# Patient Record
Sex: Female | Born: 1990 | Race: Black or African American | Hispanic: No | Marital: Married | Smoking: Never smoker
Health system: Southern US, Community
[De-identification: ages and names within clinical notes are randomized; demographics above are authoritative.]

---
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
## 2009-10-14 ENCOUNTER — Encounter: Admission: RE | Admit: 2009-10-14 | Discharge: 2009-10-14 | Payer: Self-pay | Admitting: Family Medicine

## 2014-08-29 ENCOUNTER — Emergency Department (HOSPITAL_COMMUNITY)
Admission: EM | Admit: 2014-08-29 | Discharge: 2014-08-30 | Disposition: A | Discharge status: Home / Self Care | Payer: 59 | Attending: Emergency Medicine | Admitting: Emergency Medicine

## 2014-08-29 ENCOUNTER — Other Ambulatory Visit: Payer: Self-pay

## 2014-08-29 ENCOUNTER — Encounter (HOSPITAL_COMMUNITY): Payer: Self-pay | Admitting: *Deleted

## 2014-08-29 DIAGNOSIS — R067 Sneezing: Secondary | ICD-10-CM | POA: Insufficient documentation

## 2014-08-29 DIAGNOSIS — R51 Headache: Secondary | ICD-10-CM | POA: Insufficient documentation

## 2014-08-29 DIAGNOSIS — R0981 Nasal congestion: Secondary | ICD-10-CM | POA: Insufficient documentation

## 2014-08-29 DIAGNOSIS — R0789 Other chest pain: Secondary | ICD-10-CM | POA: Diagnosis not present

## 2014-08-29 DIAGNOSIS — R002 Palpitations: Secondary | ICD-10-CM | POA: Diagnosis not present

## 2014-08-29 DIAGNOSIS — R079 Chest pain, unspecified: Secondary | ICD-10-CM | POA: Diagnosis present

## 2014-08-29 MED ORDER — GI COCKTAIL ~~LOC~~
30.0000 mL | Freq: Once | ORAL | Status: AC
  Administered 2014-08-29: 30 mL via ORAL
  Filled 2014-08-29: qty 30

## 2014-08-29 NOTE — ED Notes (Signed)
Pt placed in a gown with 5 lead, BP cuff and pulse ox on

## 2014-08-29 NOTE — ED Notes (Signed)
Pt states she has irregular periods, had 4 pregnancy tests with PCP this week which were negative

## 2014-08-29 NOTE — ED Notes (Signed)
Pt reports a "pulling" feeling on her heart about two hours ago. Pt denies any associated symptoms with the pain. Pt states pain at this time is very minimal.

## 2014-08-29 NOTE — ED Provider Notes (Signed)
CSN: [id number]     Arrival date & time 08/29/14  2225 History  This chart was scribed for Olivia Mackie, MD by Abel Presto, ED Scribe. This patient was seen in room A02C/A02C and the patient's care was started at 11:29 PM.     Chief Complaint  Patient presents with  . Chest Pain     Patient is a 24 y.o. female presenting with chest pain. The history is provided by the patient. No language interpreter was used.  Chest Pain Associated symptoms: headache (mild) and palpitations   Associated symptoms: no abdominal pain, no back pain, no cough, no diaphoresis, no nausea and no shortness of breath   HPI Comments: Erica Acosta is a 24 y.o. female who presents to the Emergency Department complaining of waxing and waning midline chest pain with onset around 8 PM. Pt describes the pain as a "pulling" feeling but denies a burning sensation. She notes initial pain lasted 2 hours.  She is currently experiencing pain but notes it is mild. Pt notes associated mild headache and brief palpitations. Pt notes congestion and sneezing last week, but denies cough.  Pt denies pain radiation. Pt states she ate quesadillas from [employer] around 4 PM and denies history of GERD. Pt denies significant PMHx, smoking, PSH, bcp use, and recent prolonged travel.  Pt has FHx of DM but denies history of blood clots. Pt denies SOB, pain with deep inspiration, diaphoresis, back pain, abdominal pain, leg swelling or tenderness, and nausea. Pt sees a doctor at [facility].  History reviewed. No pertinent past medical history. History reviewed. No pertinent past surgical history. History reviewed. No pertinent family history. History  Substance Use Topics  . Smoking status: Never Smoker   . Smokeless tobacco: Never Used  . Alcohol Use: No   OB History    No data available     Review of Systems  Constitutional: Negative for diaphoresis.  HENT: Positive for congestion (resolved) and sneezing (resolved).    Respiratory: Negative for cough and shortness of breath.   Cardiovascular: Positive for chest pain and palpitations. Negative for leg swelling.  Gastrointestinal: Negative for nausea and abdominal pain.  Musculoskeletal: Negative for back pain.  Neurological: Positive for headaches (mild).      Allergies  Sulfa antibiotics  Home Medications   Prior to Admission medications   Not on File   BP 139/79 mmHg  Pulse 105  Temp(Src) 98.8 F (37.1 C) (Oral)  Resp 18  Ht  (1.626 m)  Wt 163 lb (73.936 kg)  BMI 27.97 kg/m2  SpO2 98%  LMP 03/04/2014 Physical Exam  Constitutional: She is oriented to person, place, and time. She appears well-developed and well-nourished. No distress.  HENT:  Head: Normocephalic and atraumatic.  Right Ear: External ear normal.  Left Ear: External ear normal.  Nose: Nose normal.  Mouth/Throat: Oropharynx is clear and moist.  Eyes: Conjunctivae and EOM are normal. Pupils are equal, round, and reactive to light.  Neck: Normal range of motion. Neck supple. No JVD present. No tracheal deviation present. No thyromegaly present.  Cardiovascular: Normal rate, regular rhythm, normal heart sounds and intact distal pulses.  Exam reveals no gallop and no friction rub.   No murmur heard. Pulmonary/Chest: Effort normal and breath sounds normal. No stridor. No respiratory distress. She has no wheezes. She has no rales. She exhibits no tenderness.  Abdominal: Soft. Bowel sounds are normal. She exhibits no distension and no mass. There is no tenderness. There  is no rebound and no guarding.  Musculoskeletal: Normal range of motion. She exhibits no edema or tenderness.  Lymphadenopathy:    She has no cervical adenopathy.  Neurological: She is alert and oriented to person, place, and time. She displays normal reflexes. No cranial nerve deficit. She exhibits normal muscle tone. Coordination normal.  Skin: Skin is warm and dry. No rash noted. No erythema. No pallor.   Psychiatric: She has a normal mood and affect. Her behavior is normal. Judgment and thought content normal.  Nursing note and vitals reviewed.   ED Course  Procedures (including critical care time) DIAGNOSTIC STUDIES: Oxygen Saturation is 98% on room air, normal by my interpretation.    COORDINATION OF CARE: 11:38 PM Discussed treatment plan with patient at beside, the patient agrees with the plan and has no further questions at this time.   Labs Review Labs Reviewed  COMPREHENSIVE METABOLIC PANEL - Abnormal; Notable for the following:    Potassium 3.3 (*)    Glucose, Bld 125 (*)    GFR calc non Af Amer 90 (*)    All other components within normal limits  CBC WITH DIFFERENTIAL/PLATELET - Abnormal; Notable for the following:    WBC 11.4 (*)    Hemoglobin 10.7 (*)    HCT 33.2 (*)    MCV 69.6 (*)    MCH 22.4 (*)    RDW 15.8 (*)    Platelets 419 (*)    All other components within normal limits  TROPONIN I  D-DIMER, QUANTITATIVE    Imaging Review Dg Chest 2 View  08/30/2014   CLINICAL DATA:  Acute onset of generalized chest pain and tachycardia. Shortness of breath. Initial encounter.  EXAM: CHEST  2 VIEW  COMPARISON:  None.  FINDINGS: The lungs are well-aerated and clear. There is no evidence of focal opacification, pleural effusion or pneumothorax.  The heart is normal in size; the mediastinal contour is within normal limits. No acute osseous abnormalities are seen.  IMPRESSION: No acute cardiopulmonary process seen.   Electronically Signed   By: Roanna RaiderJeffery  Chang M.D.   On: 08/30/2014 00:22     EKG Interpretation   Date/Time:  Sunday August 29 2014 22:34:34 EST Ventricular Rate:  105 PR Interval:  122 QRS Duration: 88 QT Interval:  348 QTC Calculation: 459 R Axis:   72 Text Interpretation:  Sinus tachycardia Right atrial enlargement  Borderline ECG No old tracing to compare Confirmed by Lenoria Narine  MD, Ameir Faria  ([id number]) on 08/30/2014 12:10:57 AM       MDM   Final  diagnoses:  Chest pain  24 yo female with chest pain this evening.  No risk factors for ACS.  Pt had brief period of nonsustained sinus tachycardia upon arrival, none now.  No risk factors for DVT or PE.  Will check cxr, labs.  I personally performed the services described in this documentation, which was scribed in my presence. The recorded information has been reviewed and is accurate.   Per nursing staff, patient has had periods lasting less than 10 seconds of sinus tachycardia.  Unable to capture on monitor.  Will have patient follow-up with primary care doctor and/or cardiology for Holter monitor.  The remainder of her exam has been unremarkable.  Her pain has resolved.    Olivia Mackielga M Markanthony Gedney, MD 08/30/14 [phone]

## 2014-08-30 ENCOUNTER — Emergency Department (HOSPITAL_COMMUNITY): Payer: 59

## 2014-08-30 LAB — COMPREHENSIVE METABOLIC PANEL
ALT: 14 U/L (ref 0–35)
ANION GAP: 7 (ref 5–15)
AST: 25 U/L (ref 0–37)
Albumin: 3.5 g/dL (ref 3.5–5.2)
Alkaline Phosphatase: 67 U/L (ref 39–117)
BILIRUBIN TOTAL: 0.5 mg/dL (ref 0.3–1.2)
BUN: 9 mg/dL (ref 6–23)
CALCIUM: 9.1 mg/dL (ref 8.4–10.5)
CO2: 27 mmol/L (ref 19–32)
Chloride: 103 mmol/L (ref 96–112)
Creatinine, Ser: 0.89 mg/dL (ref 0.50–1.10)
GFR calc Af Amer: >90 mL/min (ref >90)
GFR, EST NON AFRICAN AMERICAN: 90 mL/min — AB (ref >90)
Glucose, Bld: 125 mg/dL — ABNORMAL HIGH (ref 70–99)
Potassium: 3.3 mmol/L — ABNORMAL LOW (ref 3.5–5.1)
Sodium: 137 mmol/L (ref 135–145)
Total Protein: 8.1 g/dL (ref 6.0–8.3)

## 2014-08-30 LAB — CBC WITH DIFFERENTIAL/PLATELET
BASOS PCT: 0 % (ref 0–1)
Basophils Absolute: 0 10*3/uL (ref 0.0–0.1)
EOS ABS: 0.1 10*3/uL (ref 0.0–0.7)
Eosinophils Relative: 1 % (ref 0–5)
HEMATOCRIT: 33.2 % — AB (ref 36.0–46.0)
Hemoglobin: 10.7 g/dL — ABNORMAL LOW (ref 12.0–15.0)
Lymphocytes Relative: 26 % (ref 12–46)
Lymphs Abs: 3 10*3/uL (ref 0.7–4.0)
MCH: 22.4 pg — ABNORMAL LOW (ref 26.0–34.0)
MCHC: 32.2 g/dL (ref 30.0–36.0)
MCV: 69.6 fL — AB (ref 78.0–100.0)
MONO ABS: 1 10*3/uL (ref 0.1–1.0)
Monocytes Relative: 9 % (ref 3–12)
NEUTROS ABS: 7.3 10*3/uL (ref 1.7–7.7)
NEUTROS PCT: 64 % (ref 43–77)
PLATELETS: 419 10*3/uL — AB (ref 150–400)
RBC: 4.77 MIL/uL (ref 3.87–5.11)
RDW: 15.8 % — AB (ref 11.5–15.5)
WBC: 11.4 10*3/uL — ABNORMAL HIGH (ref 4.0–10.5)

## 2014-08-30 LAB — TROPONIN I

## 2014-08-30 LAB — D-DIMER, QUANTITATIVE: D-Dimer, Quant: 0.33 ug/mL-FEU (ref 0.00–0.48)

## 2014-08-30 NOTE — Discharge Instructions (Signed)
Please follow-up with your primary care doctor and/or cardiology for further workup of your palpitations.  Most likely they will recommend you wearing a Holter monitor.  Return to emergency department for worsening condition or new concerning symptoms.   Chest Pain Observation It is often hard to give a specific diagnosis for the cause of chest pain. Among other possibilities your symptoms might be caused by inadequate oxygen delivery to your heart (angina). Angina that is not treated or evaluated can lead to a heart attack (myocardial infarction) or death. Blood tests, electrocardiograms, and X-rays may have been done to help determine a possible cause of your chest pain. After evaluation and observation, your health care provider has determined that it is unlikely your pain was caused by an unstable condition that requires hospitalization. However, a full evaluation of your pain may need to be completed, with additional diagnostic testing as directed. It is very important to keep your follow-up appointments. Not keeping your follow-up appointments could result in permanent heart damage, disability, or death. If there is any problem keeping your follow-up appointments, you must call your health care provider. HOME CARE INSTRUCTIONS  Due to the slight chance that your pain could be angina, it is important to follow your health care provider's treatment plan and also maintain a healthy lifestyle:  Maintain or work toward achieving a healthy weight.  Stay physically active and exercise regularly.  Decrease your salt intake.  Eat a balanced, healthy diet. Talk to a dietitian to learn about heart-healthy foods.  Increase your fiber intake by including whole grains, vegetables, fruits, and nuts in your diet.  Avoid situations that cause stress, anger, or depression.  Take medicines as advised by your health care provider. Report any side effects to your health care provider. Do not stop medicines or  adjust the dosages on your own.  Quit smoking. Do not use nicotine patches or gum until you check with your health care provider.  Keep your blood pressure, blood sugar, and cholesterol levels within normal limits.  Limit alcohol intake to no more than 1 drink per day for women who are not pregnant and 2 drinks per day for men.  Do not abuse drugs. SEEK IMMEDIATE MEDICAL CARE IF: You have severe chest pain or pressure which may include symptoms such as:  You feel pain or pressure in your arms, neck, jaw, or back.  You have severe back or abdominal pain, feel sick to your stomach (nauseous), or throw up (vomit).  You are sweating profusely.  You are having a fast or irregular heartbeat.  You feel short of breath while at rest.  You notice increasing shortness of breath during rest, sleep, or with activity.  You have chest pain that does not get better after rest or after taking your usual medicine.  You wake from sleep with chest pain.  You are unable to sleep because you cannot breathe.  You develop a frequent cough or you are coughing up blood.  You feel dizzy, faint, or experience extreme fatigue.  You develop severe weakness, dizziness, fainting, or chills. Any of these symptoms may represent a serious problem that is an emergency. Do not wait to see if the symptoms will go away. Call your local emergency services (911 in the [place] Do not drive yourself to the hospital. MAKE SURE YOU:  Understand these instructions.  Will watch your condition.  Will get help right away if you are not doing well or get worse. Document Released: 08/25/2010 Document Revised:  07/28/2013 Document Reviewed: 01/22/2013 ExitCare Patient Information 2015 [place]. This information is not intended to replace advice given to you by your health care provider. Make sure you discuss any questions you have with your health care provider.  Palpitations A palpitation is the feeling that your  heartbeat is irregular or is faster than normal. It may feel like your heart is fluttering or skipping a beat. Palpitations are usually not a serious problem. However, in some cases, you may need further medical evaluation. CAUSES  Palpitations can be caused by:  Smoking.  Caffeine or other stimulants, such as diet pills or energy drinks.  Alcohol.  Stress and anxiety.  Strenuous physical activity.  Fatigue.  Certain medicines.  Heart disease, especially if you have a history of irregular heart rhythms (arrhythmias), such as atrial fibrillation, atrial flutter, or supraventricular tachycardia.  An improperly working pacemaker or defibrillator. DIAGNOSIS  To find the cause of your palpitations, your health care provider will take your medical history and perform a physical exam. Your health care provider may also have you take a test called an ambulatory electrocardiogram (ECG). An ECG records your heartbeat patterns over a 24-hour period. You may also have other tests, such as:  Transthoracic echocardiogram (TTE). During echocardiography, sound waves are used to evaluate how blood flows through your heart.  Transesophageal echocardiogram (TEE).  Cardiac monitoring. This allows your health care provider to monitor your heart rate and rhythm in real time.  Holter monitor. This is a portable device that records your heartbeat and can help diagnose heart arrhythmias. It allows your health care provider to track your heart activity for several days, if needed.  Stress tests by exercise or by giving medicine that makes the heart beat faster. TREATMENT  Treatment of palpitations depends on the cause of your symptoms and can vary greatly. Most cases of palpitations do not require any treatment other than time, relaxation, and monitoring your symptoms. Other causes, such as atrial fibrillation, atrial flutter, or supraventricular tachycardia, usually require further treatment. HOME CARE  INSTRUCTIONS   Avoid:  Caffeinated coffee, tea, soft drinks, diet pills, and energy drinks.  Chocolate.  Alcohol.  Stop smoking if you smoke.  Reduce your stress and anxiety. Things that can help you relax include:  A method of controlling things in your body, such as your heartbeats, with your mind (biofeedback).  Yoga.  Meditation.  Physical activity such as swimming, jogging, or walking.  Get plenty of rest and sleep. SEEK MEDICAL CARE IF:   You continue to have a fast or irregular heartbeat beyond 24 hours.  Your palpitations occur more often. SEEK IMMEDIATE MEDICAL CARE IF:  You have chest pain or shortness of breath.  You have a severe headache.  You feel dizzy or you faint. MAKE SURE YOU:  Understand these instructions.  Will watch your condition.  Will get help right away if you are not doing well or get worse. Document Released: 07/20/2000 Document Revised: 07/28/2013 Document Reviewed: 09/21/2011 [facility] Patient Information 2015 [place], [place]. This information is not intended to replace advice given to you by your health care provider. Make sure you discuss any questions you have with your health care provider.

## 2014-08-30 NOTE — ED Notes (Signed)
Dr. Otter at the bedside.  

## 2014-09-07 ENCOUNTER — Emergency Department (HOSPITAL_COMMUNITY)
Admission: EM | Admit: 2014-09-07 | Discharge: 2014-09-07 | Disposition: A | Discharge status: Home / Self Care | Payer: 59 | Attending: Emergency Medicine | Admitting: Emergency Medicine

## 2014-09-07 ENCOUNTER — Encounter (HOSPITAL_COMMUNITY): Payer: Self-pay | Admitting: Emergency Medicine

## 2014-09-07 ENCOUNTER — Emergency Department (HOSPITAL_COMMUNITY): Payer: 59

## 2014-09-07 DIAGNOSIS — Z3202 Encounter for pregnancy test, result negative: Secondary | ICD-10-CM | POA: Insufficient documentation

## 2014-09-07 DIAGNOSIS — D649 Anemia, unspecified: Secondary | ICD-10-CM | POA: Diagnosis not present

## 2014-09-07 DIAGNOSIS — R079 Chest pain, unspecified: Secondary | ICD-10-CM | POA: Diagnosis present

## 2014-09-07 DIAGNOSIS — R51 Headache: Secondary | ICD-10-CM | POA: Diagnosis not present

## 2014-09-07 DIAGNOSIS — R202 Paresthesia of skin: Secondary | ICD-10-CM | POA: Insufficient documentation

## 2014-09-07 LAB — D-DIMER, QUANTITATIVE (NOT AT ARMC)

## 2014-09-07 LAB — I-STAT TROPONIN, ED: TROPONIN I, POC: 0 ng/mL (ref 0.00–0.08)

## 2014-09-07 LAB — BASIC METABOLIC PANEL
Anion gap: 8 (ref 5–15)
BUN: 8 mg/dL (ref 6–23)
CALCIUM: 8.5 mg/dL (ref 8.4–10.5)
CHLORIDE: 105 mmol/L (ref 96–112)
CO2: 25 mmol/L (ref 19–32)
Creatinine, Ser: 0.83 mg/dL (ref 0.50–1.10)
GFR calc Af Amer: >90 mL/min (ref >90)
GFR calc non Af Amer: >90 mL/min (ref >90)
GLUCOSE: 99 mg/dL (ref 70–99)
POTASSIUM: 3.7 mmol/L (ref 3.5–5.1)
Sodium: 138 mmol/L (ref 135–145)

## 2014-09-07 LAB — CBC WITH DIFFERENTIAL/PLATELET
BASOS PCT: 0 % (ref 0–1)
Basophils Absolute: 0 10*3/uL (ref 0.0–0.1)
EOS ABS: 0.2 10*3/uL (ref 0.0–0.7)
Eosinophils Relative: 2 % (ref 0–5)
HCT: 31.8 % — ABNORMAL LOW (ref 36.0–46.0)
Hemoglobin: 10 g/dL — ABNORMAL LOW (ref 12.0–15.0)
LYMPHS ABS: 2.9 10*3/uL (ref 0.7–4.0)
Lymphocytes Relative: 32 % (ref 12–46)
MCH: 21.9 pg — ABNORMAL LOW (ref 26.0–34.0)
MCHC: 31.4 g/dL (ref 30.0–36.0)
MCV: 69.6 fL — AB (ref 78.0–100.0)
Monocytes Absolute: 0.8 10*3/uL (ref 0.1–1.0)
Monocytes Relative: 9 % (ref 3–12)
NEUTROS PCT: 57 % (ref 43–77)
Neutro Abs: 5.3 10*3/uL (ref 1.7–7.7)
Platelets: 437 10*3/uL — ABNORMAL HIGH (ref 150–400)
RBC: 4.57 MIL/uL (ref 3.87–5.11)
RDW: 15.7 % — AB (ref 11.5–15.5)
WBC: 9.2 10*3/uL (ref 4.0–10.5)

## 2014-09-07 LAB — POC URINE PREG, ED: Preg Test, Ur: NEGATIVE

## 2014-09-07 MED ORDER — OXYCODONE-ACETAMINOPHEN 5-325 MG PO TABS: ORAL_TABLET | ORAL | Status: AC

## 2014-09-07 MED ORDER — OXYCODONE-ACETAMINOPHEN 5-325 MG PO TABS
1.0000 | ORAL_TABLET | Freq: Once | ORAL | Status: AC
  Administered 2014-09-07: 1 via ORAL
  Filled 2014-09-07: qty 1

## 2014-09-07 NOTE — Discharge Instructions (Signed)
Take percocet for breakthrough pain, do not drink alcohol, drive, care for children or do other critical tasks while taking percocet.  Please follow with your primary care doctor in the next 2 days for a check-up. They must obtain records for further management.   Do not hesitate to return to the Emergency Department for any new, worsening or concerning symptoms.    Chest Pain (Nonspecific) It is often hard to give a diagnosis for the cause of chest pain. There is always a chance that your pain could be related to something serious, such as a heart attack or a blood clot in the lungs. You need to follow up with your doctor. HOME CARE  If antibiotic medicine was given, take it as directed by your doctor. Finish the medicine even if you start to feel better.  For the next few days, avoid activities that bring on chest pain. Continue physical activities as told by your doctor.  Do not use any tobacco products. This includes cigarettes, chewing tobacco, and e-cigarettes.  Avoid drinking alcohol.  Only take medicine as told by your doctor.  Follow your doctor's suggestions for more testing if your chest pain does not go away.  Keep all doctor visits you made. GET HELP IF:  Your chest pain does not go away, even after treatment.  You have a rash with blisters on your chest.  You have a fever. GET HELP RIGHT AWAY IF:   You have more pain or pain that spreads to your arm, neck, jaw, back, or belly (abdomen).  You have shortness of breath.  You cough more than usual or cough up blood.  You have very bad back or belly pain.  You feel sick to your stomach (nauseous) or throw up (vomit).  You have very bad weakness.  You pass out (faint).  You have chills. This is an emergency. Do not wait to see if the problems will go away. Call your local emergency services (911 in [place] Do not drive yourself to the hospital. MAKE SURE YOU:   Understand these instructions.  Will watch  your condition.  Will get help right away if you are not doing well or get worse. Document Released: 01/09/2008 Document Revised: 07/28/2013 Document Reviewed: 01/09/2008 [facility]Exit[facility] Patient Information 2015 [place], [place]. This information is not intended to replace advice given to you by your health care provider. Make sure you discuss any questions you have with your health care provider.

## 2014-09-07 NOTE — ED Notes (Signed)
Pt c/o CP with radiation to arms and neck with HA today; pt seen for same last week

## 2014-09-07 NOTE — ED Provider Notes (Signed)
CSN: [id number]     Arrival date & time 09/07/14  1607 History   First MD Initiated Contact with Patient 09/07/14 1700     Chief Complaint  Patient presents with  . Chest Pain     (Consider location/radiation/quality/duration/timing/severity/associated sxs/prior Treatment) HPI   Erica Acosta is a 24 y.o. female complaining of central chest pain onset this afternoon at 2 PM rated at 8 out of 10, is unable to describe quality exacerbated by palpation radiating to back and bilateral jaws and she reports a pins and needles paresthesia in the bilateral arms. He also reports a headache which is typical for her. No pain medication taken prior to arrival. Patient had similar symptoms without the radiation of pain approximately one week ago, she was seen in the ED and had blood work, cardiac markers, chest x-ray which are negative. In chart review, I see that she had a sinus tachycardia to the 170s. Patient has followed with her primary care physician [facility] urgent care, they have done an anemia panel in addition to thyroid function tests which the patient states were normal. She was to follow with cardiology but has not yet made the appointment. Patient denies exogenous estrogen, recent travel, DVT or PE, syncope, diaphoresis  History reviewed. No pertinent past medical history. History reviewed. No pertinent past surgical history. History reviewed. No pertinent family history. History  Substance Use Topics  . Smoking status: Never Smoker   . Smokeless tobacco: Never Used  . Alcohol Use: No   OB History    No data available     Review of Systems  10 systems reviewed and found to be negative, except as noted in the HPI.   Allergies  Sulfa antibiotics  Home Medications   Prior to Admission medications   Medication Sig Start Date End Date Taking? Authorizing Provider  acetaminophen (TYLENOL) 325 MG tablet Take 650 mg by mouth every 6 (six) hours as needed for mild pain.     Historical Provider, MD   BP 120/67 mmHg  Pulse 82  Temp(Src) 98.1 F (36.7 C) (Oral)  Resp 18  SpO2 98%  LMP 03/04/2014 Physical Exam  Constitutional: She is oriented to person, place, and time. She appears well-developed and well-nourished. No distress.  HENT:  Head: Normocephalic and atraumatic.  Mouth/Throat: Oropharynx is clear and moist.  Eyes: Conjunctivae and EOM are normal. Pupils are equal, round, and reactive to light.  Neck: Normal range of motion.  Cardiovascular: Normal rate, regular rhythm and intact distal pulses.   Pulmonary/Chest: Effort normal and breath sounds normal. No stridor. No respiratory distress. She has no wheezes. She has no rales. She exhibits no tenderness.  Abdominal: Soft. Bowel sounds are normal. She exhibits no distension and no mass. There is no tenderness. There is no rebound and no guarding.  Musculoskeletal: Normal range of motion. She exhibits no edema or tenderness.  No calf asymmetry, superficial collaterals, palpable cords, edema, Homans sign negative bilaterally.    Neurological: She is alert and oriented to person, place, and time.  Psychiatric: She has a normal mood and affect.  Nursing note and vitals reviewed.   ED Course  Procedures (including critical care time) Labs Review Labs Reviewed  CBC WITH DIFFERENTIAL/PLATELET - Abnormal; Notable for the following:    Hemoglobin 10.0 (*)    HCT 31.8 (*)    MCV 69.6 (*)    MCH 21.9 (*)    RDW 15.7 (*)    Platelets 437 (*)    All  other components within normal limits  BASIC METABOLIC PANEL  D-DIMER, QUANTITATIVE  I-STAT TROPOININ, ED  POC URINE PREG, ED    Imaging Review Dg Chest 2 View  09/07/2014   CLINICAL DATA:  Chest pain for 1 day, labored breathing  EXAM:  CHEST  2 VIEW  COMPARISON:  08/30/2014  FINDINGS: The heart size and mediastinal contours are within normal limits. Both lungs are clear. The visualized skeletal structures are unremarkable.  IMPRESSION: No active  cardiopulmonary disease.   Electronically Signed   By: Natasha MeadLiviu  Pop M.D.   On: 09/07/2014 16:48     EKG Interpretation None      MDM   Final diagnoses:  Chest pain, unspecified chest pain type    Filed Vitals:   09/07/14 1620 09/07/14 1810 09/07/14 1815  BP: 120/67 105/30 100/54  Pulse: 82 102 62  Temp: 98.1 F (36.7 C)    TempSrc: Oral    Resp: 18 16 17   SpO2: 98% 100% 100%    Medications  oxyCODONE-acetaminophen (PERCOCET/ROXICET) 5-325 MG per tablet 1 tablet (1 tablet Oral Given 09/07/14 1809)    [name] is a pleasant 24 y.o. female presenting with CP patient was seen for similar last week. States that this pain is slightly different because it radiates to the jaw and back. EKG with no acute abnormalities, blood work shows a mild anemia. EKG shows a right atrial enlargement which she's had in the past. Chest x-ray without abnormality and d-dimer is negative. Pain is improved in the ED given her [facility]. I've encouraged her to follow closely with him we've had an extensive discussion of return precautions.   Evaluation does not show pathology that would require ongoing emergent intervention or inpatient treatment. Pt is hemodynamically stable and mentating appropriately. Discussed findings and plan with patient/guardian, who agrees with care plan. All questions answered. Return precautions discussed and outpatient follow up given.   New Prescriptions   OXYCODONE-ACETAMINOPHEN (PERCOCET/ROXICET) 5-325 MG PER TABLET    1 to 2 tabs PO q6hrs  PRN for pain         Wynetta [place], PA-C 09/07/14 2340  Toy CookeyMegan Docherty, MD 09/08/14 [phone]

## 2014-09-30 ENCOUNTER — Encounter: Payer: Self-pay | Admitting: Cardiovascular Disease

## 2014-09-30 ENCOUNTER — Ambulatory Visit (INDEPENDENT_AMBULATORY_CARE_PROVIDER_SITE_OTHER): Payer: 59 | Admitting: Cardiovascular Disease

## 2014-09-30 VITALS — BP 110/70 | HR 91 | Ht 64.0 in | Wt 169.1 lb

## 2014-09-30 DIAGNOSIS — R079 Chest pain, unspecified: Secondary | ICD-10-CM | POA: Insufficient documentation

## 2014-09-30 DIAGNOSIS — R0789 Other chest pain: Secondary | ICD-10-CM

## 2014-09-30 NOTE — Progress Notes (Signed)
Cardiology Office Note   Date:  09/30/2014   ID:  Erica Acosta, Erica Acosta 1991-05-15, MRN [id number]  PCP:  PROVIDER NOT IN SYSTEM  Cardiologist:   Nahser, Deloris Ping, MD   Chief Complaint  Patient presents with  . Chest Pain   Problem List 1. Chest pain     History of Present Illness: Erica Acosta is a 24 y.o. female who presents for follow up of several episodes of cp. Erica Acosta is a relatively healthy young female. She's been seen twice in the emergency room for chest discomfort. Both evaluations were negative. She does not exercise regulalry. Has joined the gym and her work outs have been ok.  No CP or dyspnea  The pains were mid sternal.  Felt like a pulling sensation.  Would last 4 hours the first time.  The pain eventually resolved overnight.   Would then come and go every wee or so. She had more CP on Feb. 2.   D-dimer  was negative for PE  She eats an unrestricted diet.   Is working on that.  Needs to cut back on her sugar.    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     No past medical history on file.  No past surgical history on file.   Current Outpatient Prescriptions  Medication Sig Dispense Refill  . acetaminophen (TYLENOL) 325 MG tablet Take 650 mg by mouth every 6 (six) hours as needed for mild pain.    Marland Kitchen oxyCODONE-acetaminophen (PERCOCET/ROXICET) 5-325 MG per tablet 1 to 2 tabs PO q6hrs  PRN for pain 15 tablet 0   No current facility-administered medications for this visit.    Allergies:   Sulfa antibiotics    Social History:  The patient  reports that she has never smoked. She has never used smokeless tobacco. She reports that she does not drink alcohol or use illicit drugs.   Family History:  The patient's family history is not on file.    ROS:  Please see the history of present illness.    Review of Systems: Constitutional:  denies fever, chills, diaphoresis, appetite change and fatigue.  HEENT: denies photophobia, eye pain, redness,  hearing loss, ear pain, congestion, sore throat, rhinorrhea, sneezing, neck pain, neck stiffness and tinnitus.  Respiratory: denies SOB, DOE, cough, chest tightness, and wheezing.  Cardiovascular: admits to chest pain, denies any palpitations or  leg swelling.  Gastrointestinal: denies nausea, vomiting, abdominal pain, diarrhea, constipation, blood in stool.  Genitourinary: denies dysuria, urgency, frequency, hematuria, flank pain and difficulty urinating.  Musculoskeletal: denies  myalgias, back pain, joint swelling, arthralgias and gait problem.   Skin: denies pallor, rash and wound.  Neurological: denies dizziness, seizures, syncope, weakness, light-headedness, numbness and headaches.   Hematological: denies adenopathy, easy bruising, personal or family bleeding history.  Psychiatric/ Behavioral: denies suicidal ideation, mood changes, confusion, nervousness, sleep disturbance and agitation.       All other systems are reviewed and negative.    PHYSICAL EXAM: VS:  BP 110/70 mmHg  Pulse 91  Ht  (1.626 m)  Wt 169 lb 1.9 oz (76.712 kg)  BMI 29.01 kg/m2  SpO2 98% , BMI Body mass index is 29.01 kg/(m^2). GEN: Well nourished, well developed, in no acute distress HEENT: normal Neck: no JVD, carotid bruits, or masses Cardiac: RRR; no murmurs, rubs, or gallops,no edema , no chest wall tendernes  Respiratory:  clear to auscultation bilaterally, normal work of breathing GI: soft, nontender, nondistended, + BS MS: no  deformity or atrophy Skin: warm and dry, no rash Neuro:  Strength and sensation are intact Psych: normal   EKG:  EKG is not ordered today.    Recent Labs: 08/29/2014: ALT 14 09/07/2014: BUN 8; Creatinine 0.83; Hemoglobin 10.0*; Platelets 437*; Potassium 3.7; Sodium 138    Lipid Panel No results found for: CHOL, TRIG, HDL, CHOLHDL, VLDL, LDLCALC, LDLDIRECT    Wt Readings from Last 3 Encounters:  09/30/14 169 lb 1.9 oz (76.712 kg)  08/29/14 163 lb (73.936 kg)       Other studies Reviewed: Additional studies/ records that were reviewed today include: . Review of the above records demonstrates:    ASSESSMENT AND PLAN:  1. Noncardiac chest pain: The patient has been to the emergency room on 2 different occasions for chest pain. She's had thorough workups. There is no evidence of heart disease. She also had a negative d-dimer.  I do not think that she needs any additional workup. Her chest pains are clearly noncardiac. She has recently joined a gym and his workout several times without any episodes of chest pain or shortness of breath. She admits that she eats an unrestricted and fairly unhealthy diet. I've recommended that she work on improving her diet. I've recommended that she work out on a regular  basis. We'll see her again on an as-needed basis.   Current medicines are reviewed at length with the patient today.  The patient does not have concerns regarding medicines.  The following changes have been made:  no change   Disposition:   FU with me as needed     Signed, Nahser, Deloris PingPhilip J, MD  09/30/2014 11:44 AM    [facility] [address], [place], [place]  [address] Phone: [phone]; Fax: [phone]

## 2014-09-30 NOTE — Patient Instructions (Signed)
Your physician recommends that you continue on your current medications as directed. Please refer to the Current Medication list given to you today.  Your physician recommends that you schedule a follow-up appointment in: as needed with Dr. Nahser  

## 2015-01-05 ENCOUNTER — Encounter (HOSPITAL_COMMUNITY): Payer: Self-pay | Admitting: Emergency Medicine

## 2015-01-05 ENCOUNTER — Emergency Department (INDEPENDENT_AMBULATORY_CARE_PROVIDER_SITE_OTHER)
Admission: EM | Admit: 2015-01-05 | Discharge: 2015-01-05 | Disposition: A | Discharge status: Home / Self Care | Payer: Self-pay | Source: Home / Self Care | Attending: Emergency Medicine | Admitting: Emergency Medicine

## 2015-01-05 DIAGNOSIS — H6121 Impacted cerumen, right ear: Secondary | ICD-10-CM

## 2015-01-05 NOTE — ED Notes (Signed)
Pt states that her ear feels full for over a week

## 2015-01-05 NOTE — Discharge Instructions (Signed)
We removed earwax from your ear today. You can use a drop of olive oil in each ear to help soften wax and have come out on its own. Follow-up as needed.

## 2015-01-05 NOTE — ED Provider Notes (Signed)
CSN: [id number]     Arrival date & time 01/05/15  1834 History   First MD Initiated Contact with Patient 01/05/15 1856     Chief Complaint  Patient presents with  . Ear Fullness   (Consider location/radiation/quality/duration/timing/severity/associated sxs/prior Treatment) HPI  She is a 24 year old woman here for evaluation of right ear fullness. She states and Friday her right ear has felt stopped up. It will temporarily open up while she is in the shower. She denies any ear pain or drainage. She tried washing it out herself without improvement. No nasal congestion or rhinorrhea. No fevers.  History reviewed. No pertinent past medical history. History reviewed. No pertinent past surgical history. History reviewed. No pertinent family history. History  Substance Use Topics  . Smoking status: Never Smoker   . Smokeless tobacco: Never Used  . Alcohol Use: No   OB History    No data available     Review of Systems As in history of present illness Allergies  Sulfa antibiotics  Home Medications   Prior to Admission medications   Medication Sig Start Date End Date Taking? Authorizing Provider  acetaminophen (TYLENOL) 325 MG tablet Take 650 mg by mouth every 6 (six) hours as needed for mild pain.    Historical Provider, MD  oxyCODONE-acetaminophen (PERCOCET/ROXICET) 5-325 MG per tablet 1 to 2 tabs PO q6hrs  PRN for pain 09/07/14   Joni ReiningNicole Pisciotta, PA-C   BP 110/70 mmHg  Pulse 85  Temp(Src) 97.7 F (36.5 C) (Oral)  Resp 12  SpO2 96% Physical Exam  Constitutional: She is oriented to person, place, and time. She appears well-developed and well-nourished. No distress.  HENT:  Left TM is normal. Right ear canal is occluded with wax.  Neck: Neck supple.  Cardiovascular: Normal rate.   Pulmonary/Chest: Effort normal.  Neurological: She is alert and oriented to person, place, and time.    ED Course  Procedures (including critical care time) Labs Review Labs Reviewed - No data to  display  Imaging Review No results found.   MDM   1. Cerumen impaction, right    Ear washed out with resolution of symptoms.  Tympanic membrane is normal. Follow-up as needed.    Charm RingsErin J Khaleelah Yowell, MD 01/05/15 1950

## 2015-05-07 IMAGING — DX DG CHEST 2V
2 series · 2 of 2 positions shown · non-contrast
Comparison: 08/30/2014

CLINICAL DATA: Chest pain for 1 day, labored breathing

EXAM:
CHEST  2 VIEW

[chest pa]
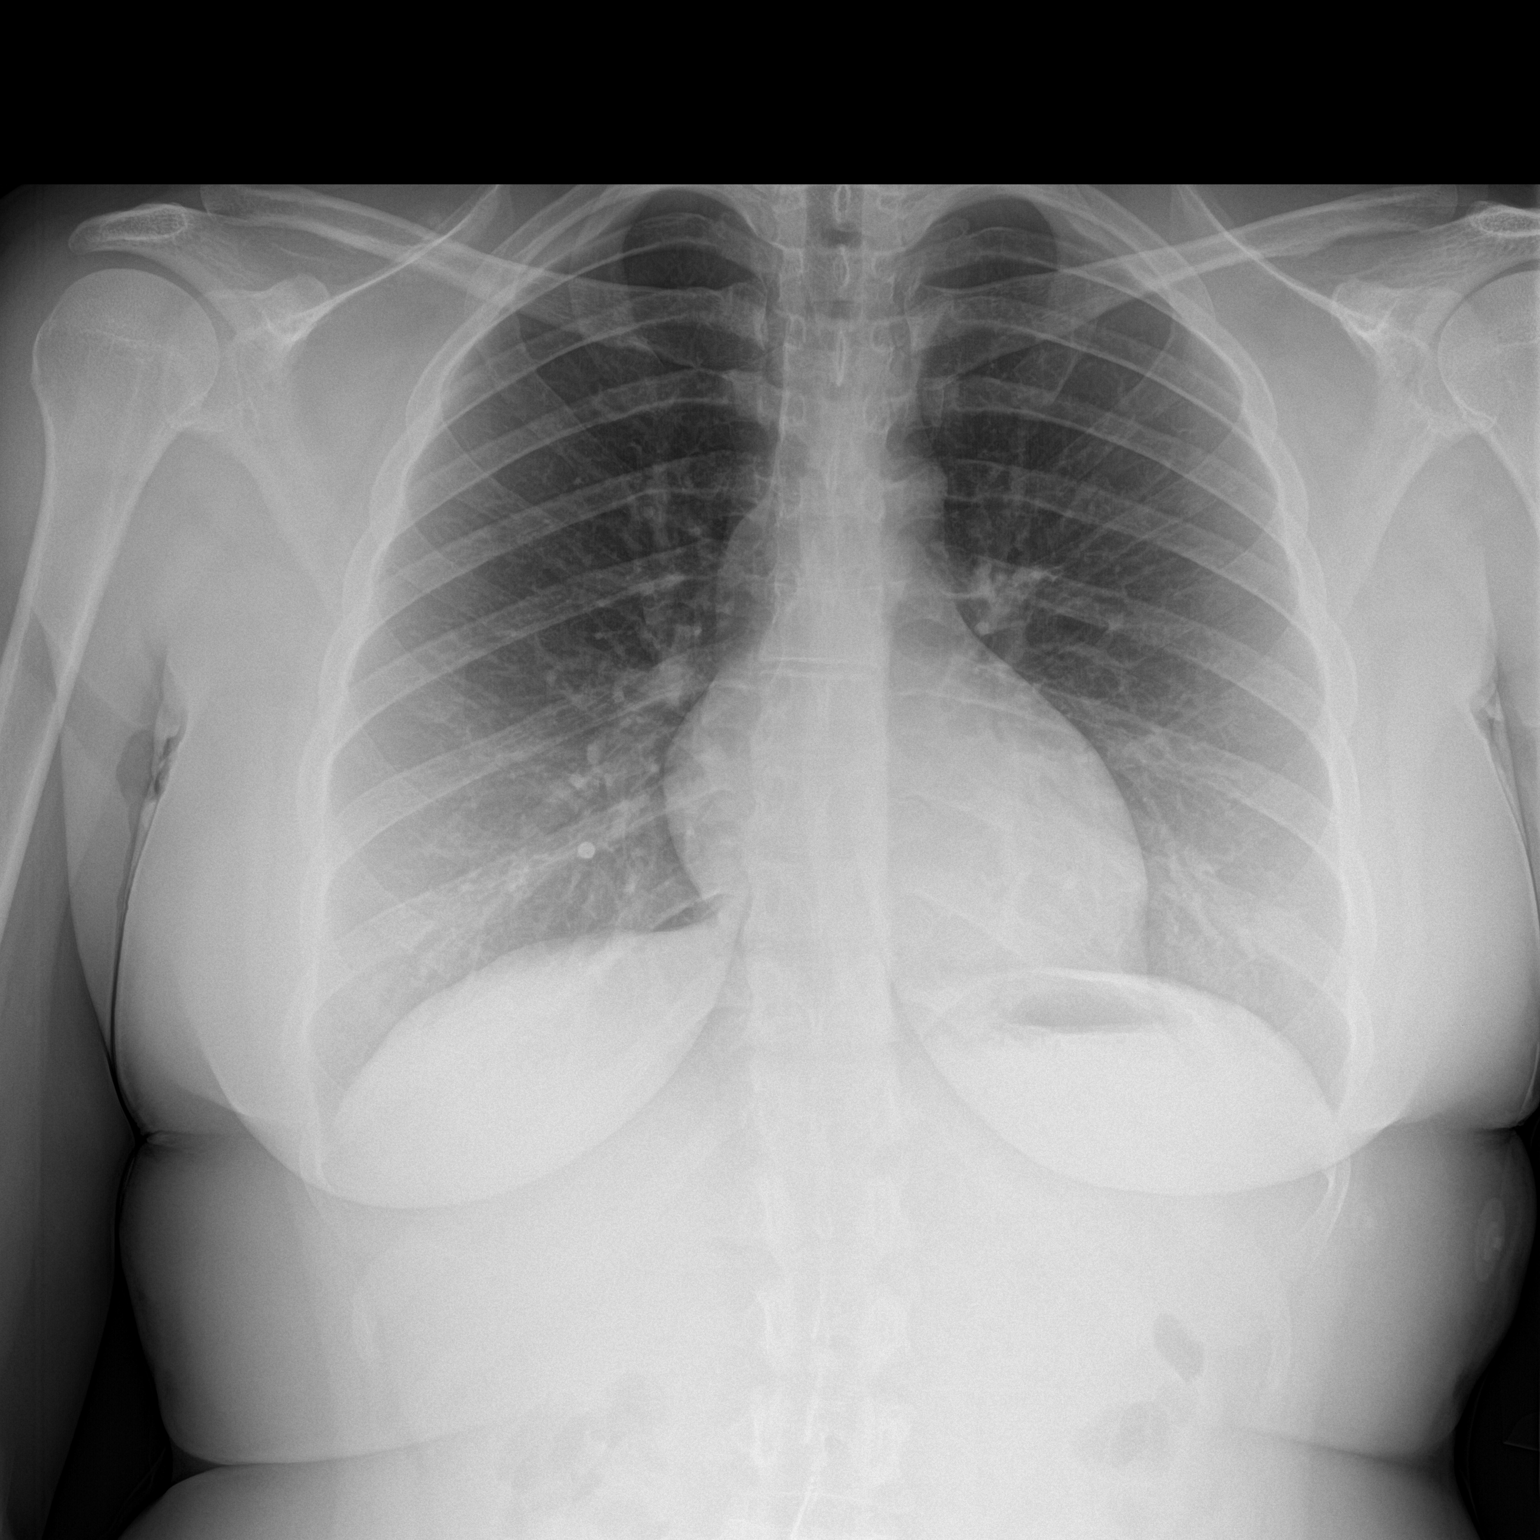

[chest lat]
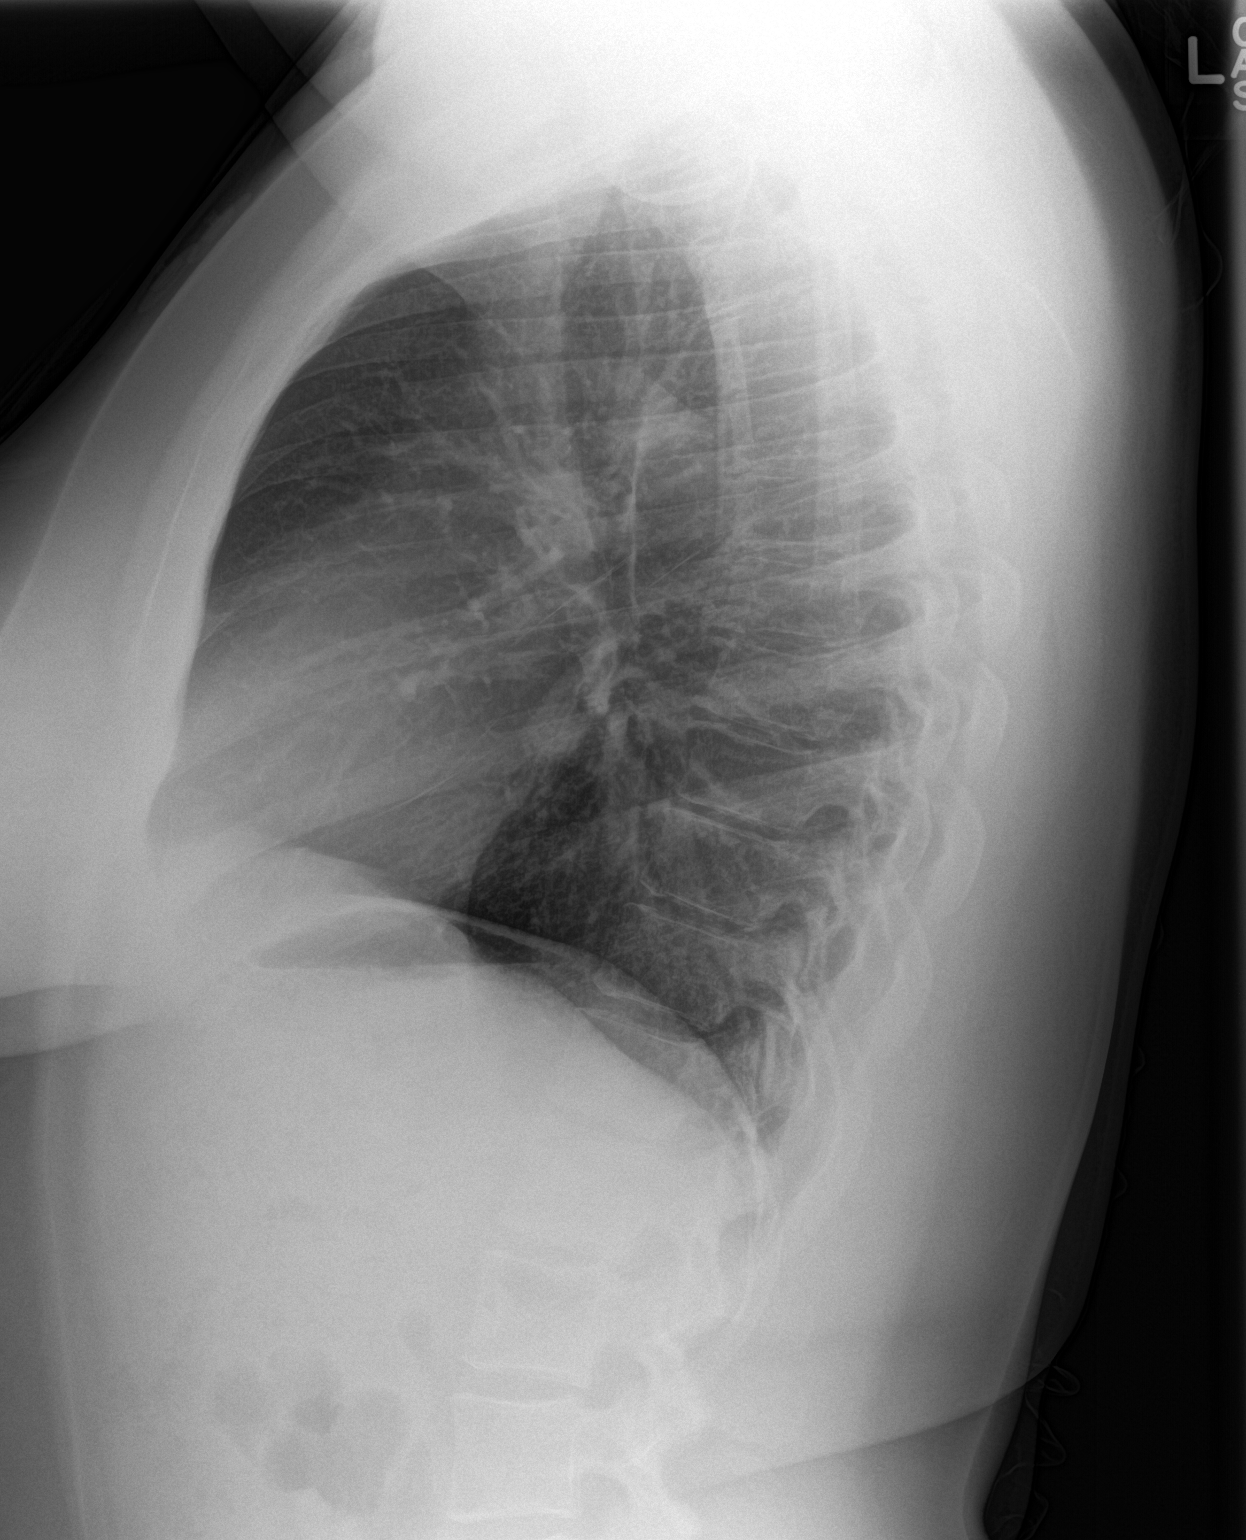

[2 of 2 positions shown; findings below may reference images not displayed]

FINDINGS: The heart size and mediastinal contours are within normal limits.
Both lungs are clear. The visualized skeletal structures are
unremarkable.
IMPRESSION: No active cardiopulmonary disease.

## 2015-06-09 ENCOUNTER — Emergency Department (HOSPITAL_COMMUNITY): Payer: Self-pay

## 2015-06-09 ENCOUNTER — Encounter (HOSPITAL_COMMUNITY): Payer: Self-pay | Admitting: Emergency Medicine

## 2015-06-09 ENCOUNTER — Emergency Department (HOSPITAL_COMMUNITY)
Admission: EM | Admit: 2015-06-09 | Discharge: 2015-06-09 | Disposition: A | Discharge status: Home / Self Care | Payer: Self-pay | Attending: Emergency Medicine | Admitting: Emergency Medicine

## 2015-06-09 DIAGNOSIS — R000 Tachycardia, unspecified: Secondary | ICD-10-CM | POA: Insufficient documentation

## 2015-06-09 DIAGNOSIS — M791 Myalgia: Secondary | ICD-10-CM | POA: Insufficient documentation

## 2015-06-09 DIAGNOSIS — N898 Other specified noninflammatory disorders of vagina: Secondary | ICD-10-CM | POA: Insufficient documentation

## 2015-06-09 DIAGNOSIS — R509 Fever, unspecified: Secondary | ICD-10-CM | POA: Insufficient documentation

## 2015-06-09 DIAGNOSIS — R202 Paresthesia of skin: Secondary | ICD-10-CM | POA: Insufficient documentation

## 2015-06-09 DIAGNOSIS — R102 Pelvic and perineal pain: Secondary | ICD-10-CM | POA: Insufficient documentation

## 2015-06-09 DIAGNOSIS — Z3202 Encounter for pregnancy test, result negative: Secondary | ICD-10-CM | POA: Insufficient documentation

## 2015-06-09 DIAGNOSIS — R51 Headache: Secondary | ICD-10-CM | POA: Insufficient documentation

## 2015-06-09 LAB — WET PREP, GENITAL
Clue Cells Wet Prep HPF POC: NONE SEEN
Trich, Wet Prep: NONE SEEN
YEAST WET PREP: NONE SEEN

## 2015-06-09 LAB — URINALYSIS, ROUTINE W REFLEX MICROSCOPIC
Bilirubin Urine: NEGATIVE
Glucose, UA: NEGATIVE mg/dL
Hgb urine dipstick: NEGATIVE
KETONES UR: NEGATIVE mg/dL
LEUKOCYTES UA: NEGATIVE
NITRITE: NEGATIVE
PH: 7.5 (ref 5.0–8.0)
PROTEIN: NEGATIVE mg/dL
SPECIFIC GRAVITY, URINE: 1 — AB (ref 1.005–1.030)
Urobilinogen, UA: 0.2 mg/dL (ref 0.0–1.0)

## 2015-06-09 LAB — COMPREHENSIVE METABOLIC PANEL
ALBUMIN: 4 g/dL (ref 3.5–5.0)
ALT: 17 U/L (ref 14–54)
AST: 23 U/L (ref 15–41)
Alkaline Phosphatase: 63 U/L (ref 38–126)
Anion gap: 7 (ref 5–15)
BUN: 7 mg/dL (ref 6–20)
CHLORIDE: 104 mmol/L (ref 101–111)
CO2: 23 mmol/L (ref 22–32)
Calcium: 9.1 mg/dL (ref 8.9–10.3)
Creatinine, Ser: 0.85 mg/dL (ref 0.44–1.00)
GFR calc Af Amer: >60 mL/min (ref >60)
Glucose, Bld: 108 mg/dL — ABNORMAL HIGH (ref 65–99)
POTASSIUM: 3.5 mmol/L (ref 3.5–5.1)
SODIUM: 134 mmol/L — AB (ref 135–145)
Total Bilirubin: 0.4 mg/dL (ref 0.3–1.2)
Total Protein: 8.7 g/dL — ABNORMAL HIGH (ref 6.5–8.1)

## 2015-06-09 LAB — I-STAT BETA HCG BLOOD, ED (MC, WL, AP ONLY): I-stat hCG, quantitative: <5 m[IU]/mL (ref <5)

## 2015-06-09 LAB — CBC
HEMATOCRIT: 31.1 % — AB (ref 36.0–46.0)
Hemoglobin: 9.8 g/dL — ABNORMAL LOW (ref 12.0–15.0)
MCH: 22 pg — ABNORMAL LOW (ref 26.0–34.0)
MCHC: 31.5 g/dL (ref 30.0–36.0)
MCV: 69.7 fL — AB (ref 78.0–100.0)
Platelets: 346 10*3/uL (ref 150–400)
RBC: 4.46 MIL/uL (ref 3.87–5.11)
RDW: 15.5 % (ref 11.5–15.5)
WBC: 7.4 10*3/uL (ref 4.0–10.5)

## 2015-06-09 LAB — LIPASE, BLOOD: LIPASE: 35 U/L (ref 11–51)

## 2015-06-09 MED ORDER — IBUPROFEN 600 MG PO TABS: 600.0000 mg | ORAL_TABLET | Freq: Four times a day (QID) | ORAL | Status: AC | PRN

## 2015-06-09 MED ORDER — DOXYCYCLINE HYCLATE 100 MG PO CAPS: 100.0000 mg | ORAL_CAPSULE | Freq: Two times a day (BID) | ORAL | Status: AC

## 2015-06-09 MED ORDER — ACETAMINOPHEN 325 MG PO TABS
650.0000 mg | ORAL_TABLET | Freq: Once | ORAL | Status: AC
  Administered 2015-06-09: 650 mg via ORAL
  Filled 2015-06-09: qty 2

## 2015-06-09 MED ORDER — DOXYCYCLINE HYCLATE 100 MG PO TABS
100.0000 mg | ORAL_TABLET | Freq: Once | ORAL | Status: AC
  Administered 2015-06-09: 100 mg via ORAL
  Filled 2015-06-09: qty 1

## 2015-06-09 MED ORDER — CEFTRIAXONE SODIUM 250 MG IJ SOLR
250.0000 mg | INTRAMUSCULAR | Status: DC
  Administered 2015-06-09: 250 mg via INTRAMUSCULAR
  Filled 2015-06-09: qty 250

## 2015-06-09 NOTE — ED Notes (Signed)
Pt went to urgent care today for weakness, leg tingling, headache. They told her she may be miscarrying and needed to go to the ER. States she didn't know she was pregnant, believes she may have been 1 month pregnant. Pt tachycardic at 120, mild fever. Denies pain, slight cramping in abdomin.

## 2015-06-09 NOTE — ED Provider Notes (Signed)
CSN: [id number]     Arrival date & time 06/09/15  1905 History   First MD Initiated Contact with Patient 06/09/15 1946     Chief Complaint  Patient presents with  . Tachycardia  . Chills  . Possible Pregnancy     (Consider location/radiation/quality/duration/timing/severity/associated sxs/prior Treatment) HPI Erica Acosta is a 24 y.o. female with no medical problems, presents to ED with complaint of fever, chills, legs tingling, headache. Went to UC where they told her she may have retained products of conception from possible miscarriage. Pt states it is possible she may have been pregnant last month but states she took a preg test and it was negative. She denies any abdominal pain. Admits to some abnormal vaginal discharge. Denies URI symptoms. Admits to nausea, no vomiting or diarrhea. No chest pain or back pain. No urinary symptoms. No medications prior to coming in. No one sick around her.   History reviewed. No pertinent past medical history. History reviewed. No pertinent past surgical history. History reviewed. No pertinent family history. Social History  Substance Use Topics  . Smoking status: Never Smoker   . Smokeless tobacco: Never Used  . Alcohol Use: No   OB History    No data available     Review of Systems  Constitutional: Positive for fever and chills.  Respiratory: Negative for cough, chest tightness and shortness of breath.   Cardiovascular: Negative for chest pain, palpitations and leg swelling.  Gastrointestinal: Negative for nausea, vomiting, abdominal pain and diarrhea.  Genitourinary: Positive for vaginal discharge. Negative for dysuria, flank pain, vaginal bleeding, vaginal pain and pelvic pain.  Musculoskeletal: Positive for myalgias. Negative for arthralgias, neck pain and neck stiffness.  Skin: Negative for rash.  Neurological: Positive for headaches. Negative for dizziness and weakness.  All other systems reviewed and are negative.     Allergies   Sulfa antibiotics  Home Medications   Prior to Admission medications   Medication Sig Start Date End Date Taking? Authorizing Provider  acetaminophen (TYLENOL) 325 MG tablet Take 650 mg by mouth every 6 (six) hours as needed for mild pain.   Yes Historical Provider, MD  oxyCODONE-acetaminophen (PERCOCET/ROXICET) 5-325 MG per tablet 1 to 2 tabs PO q6hrs  PRN for pain Patient not taking: Reported on 06/09/2015 09/07/14   Joni Reining Pisciotta, PA-C   BP 130/70 mmHg  Pulse 117  Temp(Src) 101.6 F (38.7 C) (Oral)  Resp 20  SpO2 99% Physical Exam  Constitutional: She is oriented to person, place, and time. She appears well-developed and well-nourished. No distress.  HENT:  Head: Normocephalic.  Right Ear: External ear normal.  Left Ear: External ear normal.  Nose: Nose normal.  Mouth/Throat: Oropharynx is clear and moist.  Eyes: Conjunctivae are normal.  Neck: Normal range of motion. Neck supple.  No meningismus  Cardiovascular: Normal rate, regular rhythm and normal heart sounds.   Pulmonary/Chest: Effort normal and breath sounds normal. No respiratory distress. She has no wheezes. She has no rales.  Abdominal: Soft. Bowel sounds are normal. She exhibits no distension. There is no tenderness. There is no rebound and no guarding.  Musculoskeletal: She exhibits no edema.  Neurological: She is alert and oriented to person, place, and time. No cranial nerve deficit. Coordination normal.  Skin: Skin is warm and dry.  Psychiatric: She has a normal mood and affect. Her behavior is normal.  Nursing note and vitals reviewed.   ED Course  Procedures (including critical care time) Labs Review Labs Reviewed  COMPREHENSIVE METABOLIC PANEL -  Abnormal; Notable for the following:    Sodium 134 (*)    Glucose, Bld 108 (*)    Total Protein 8.7 (*)    All other components within normal limits  CBC - Abnormal; Notable for the following:    Hemoglobin 9.8 (*)    HCT 31.1 (*)    MCV 69.7 (*)    MCH  22.0 (*)    All other components within normal limits  URINALYSIS, ROUTINE W REFLEX MICROSCOPIC (NOT AT [facility]) - Abnormal; Notable for the following:    Specific Gravity, Urine 1.000 (*)    All other components within normal limits  WET PREP, GENITAL  LIPASE, BLOOD  I-STAT BETA HCG BLOOD, ED ([facility], [facility], [facility] ONLY)  GC/CHLAMYDIA PROBE AMP ([place]) NOT AT [facility]    Imaging Review US Pelvis Complete  06/09/2015  CLINICAL DATA:  Pelvic pain. EXAM: TRANSABDOMINAL ULTRASOUND OF PELVIS TECHNIQUE: Transabdominal ultrasound examination of the pelvis was performed including evaluation of the uterus, ovaries, adnexal regions, and pelvic cul-de-sac. COMPARISON:  None. FINDINGS: Uterus Not visualized. Endometrium Not visualized Right ovary Not visualized Left ovary Not visualized Other findings: There are significant study limitations as the patient was in too much pain for an endovaginal examination and her bladder was completely empty. The uterus was not visible. Neither ovary was visible. IMPRESSION: Technically inadequate study due to patient discomfort and empty bladder. Uterus and ovaries were not visible. Shadowing bowel gas completely obscures the pelvic midline and adnexal regions. Electronically Signed   By: Ellery Plunk M.D.   On: 06/09/2015 22:10   I have personally reviewed and evaluated these images and lab results as part of my medical decision-making.   EKG Interpretation None      MDM   Final diagnoses:  Pelvic pain in female    patient with fever of 101.6, mild tachycardia, no other complaints. She does report some abnormal vaginal discharge, otherwise she reports no complaints which would explain her symptoms. Will do a pelvic exam, get basic labs, check urinalysis and pregnancy test. Patient reports some concern about being pregnant especially because urgent care suggested that as a possibility, however she states she took several pregnancy tests last month and they're all  negative.  I was unable to perform patient's pelvic exam due to severe discomfort. Her external genitalia is normal, there is white vaginal discharge that is at the vaginal opening. I was unable to even put pediatric speculum into her vaginal canal. This is concerning for possible pelvic infection. Patient states that she has had pelvic exams before and has had intercourse and it was not painful. We will try to get ultrasound for further imaging.  Patient was able to put a Q-tip just at the opening in her vaginal canal, a GC/Chlamydia sent, wet prep showed few white blood cells. They were unable to perform her ultrasound intravaginally either. Transabdominal ultrasound was unhelpful since it was not able to visualize her uterus or ovaries. I will treat her as PID. Rocephin 250 mg given in emergency department. Will discharge on doxycycline. Splint to her that we were unable to visualize her uterus and that if her symptoms continue or get worse she will need to follow-up with OB[facility]GYN or [facility]. Patient voiced understanding.   Filed Vitals:   06/09/15 1911 06/09/15 2221  BP: 130/70 101/59  Pulse: 117 102  Temp: 101.6 F (38.7 C) 99 F (37.2 C)  TempSrc: Oral Oral  Resp: 20 18  SpO2: 99% 100%  Jaynie Crumbleatyana Emi Lymon, PA-C 06/10/15 0015  Marily MemosJason Mesner, MD 06/14/15 1431

## 2015-06-09 NOTE — ED Notes (Signed)
Bed: WA08 Expected date:  Expected time:  Means of arrival:  Comments: TR 2, call when ready

## 2015-06-09 NOTE — Discharge Instructions (Signed)
Ibuprofen as prescribed as needed for pain and fever. Take doxycycline as prescribed until all gone for possible pelvic infection. No intercourse for a week. [facility]GYN if her symptoms are not improving or worsening. Return if have new concerning symptoms.   Pelvic Pain, Female Female pelvic pain can be caused by many different things and start from a variety of places. Pelvic pain refers to pain that is located in the lower half of the abdomen and between your hips. The pain may occur over a short period of time (acute) or may be reoccurring (chronic). The cause of pelvic pain may be related to disorders affecting the female reproductive organs (gynecologic), but it may also be related to the bladder, kidney stones, an intestinal complication, or muscle or skeletal problems. Getting help right away for pelvic pain is important, especially if there has been severe, sharp, or a sudden onset of unusual pain. It is also important to get help right away because some types of pelvic pain can be life threatening.  CAUSES  Below are only some of the causes of pelvic pain. The causes of pelvic pain can be in one of several categories.   Gynecologic.  Pelvic inflammatory disease.  Sexually transmitted infection.  Ovarian cyst or a twisted ovarian ligament (ovarian torsion).  Uterine lining that grows outside the uterus (endometriosis).  Fibroids, cysts, or tumors.  Ovulation.  Pregnancy.  Pregnancy that occurs outside the uterus (ectopic pregnancy).  Miscarriage.  Labor.  Abruption of the placenta or ruptured uterus.  Infection.  Uterine infection (endometritis).  Bladder infection.  Diverticulitis.  Miscarriage related to a uterine infection (septic abortion).  Bladder.  Inflammation of the bladder (cystitis).  Kidney stone(s).  Gastrointestinal.  Constipation.  Diverticulitis.  Neurologic.  Trauma.  Feeling pelvic pain  because of mental or emotional causes (psychosomatic).  Cancers of the bowel or pelvis. EVALUATION  Your caregiver will want to take a careful history of your concerns. This includes recent changes in your health, a careful gynecologic history of your periods (menses), and a sexual history. Obtaining your family history and medical history is also important. Your caregiver may suggest a pelvic exam. A pelvic exam will help identify the location and severity of the pain. It also helps in the evaluation of which organ system may be involved. In order to identify the cause of the pelvic pain and be properly treated, your caregiver may order tests. These tests may include:   A pregnancy test.  Pelvic ultrasonography.  An X-ray exam of the abdomen.  A urinalysis or evaluation of vaginal discharge.  Blood tests. HOME CARE INSTRUCTIONS   Only take over-the-counter or prescription medicines for pain, discomfort, or fever as directed by your caregiver.   Rest as directed by your caregiver.   Eat a balanced diet.   Drink enough fluids to make your urine clear or pale yellow, or as directed.   Avoid sexual intercourse if it causes pain.   Apply warm or cold compresses to the lower abdomen depending on which one helps the pain.   Avoid stressful situations.   Keep a journal of your pelvic pain. Write down when it started, where the pain is located, and if there are things that seem to be associated with the pain, such as food or your menstrual cycle.  Follow up with your caregiver as directed.  SEEK MEDICAL CARE IF:  Your medicine does not help your pain.  You have abnormal vaginal  discharge. SEEK IMMEDIATE MEDICAL CARE IF:   You have heavy bleeding from the vagina.   Your pelvic pain increases.   You feel light-headed or faint.   You have chills.   You have pain with urination or blood in your urine.   You have uncontrolled diarrhea or vomiting.   You have a  fever or persistent symptoms for more than 3 days.  You have a fever and your symptoms suddenly get worse.   You are being physically or sexually abused.   This information is not intended to replace advice given to you by your health care provider. Make sure you discuss any questions you have with your health care provider.   Document Released: 06/19/2004 Document Revised: 04/13/2015 Document Reviewed: 11/12/2011 Elsevier Interactive Patient Education [employer]

## 2015-06-10 LAB — GC/CHLAMYDIA PROBE AMP (~~LOC~~) NOT AT ARMC
CHLAMYDIA, DNA PROBE: NEGATIVE
NEISSERIA GONORRHEA: NEGATIVE

## 2015-07-26 ENCOUNTER — Telehealth: Payer: Self-pay | Admitting: Hematology

## 2015-07-26 NOTE — Telephone Encounter (Signed)
CALLED PT LEFT VM IN REF TO NP APPT. °

## 2015-07-28 ENCOUNTER — Telehealth: Payer: Self-pay | Admitting: Oncology

## 2015-07-28 NOTE — Telephone Encounter (Signed)
PT CONFIRMED NEW PT REFERRAL APPT FOR 08/08/14

## 2015-08-09 ENCOUNTER — Ambulatory Visit (HOSPITAL_BASED_OUTPATIENT_CLINIC_OR_DEPARTMENT_OTHER): Payer: BLUE CROSS/BLUE SHIELD | Admitting: Oncology

## 2015-08-09 ENCOUNTER — Telehealth: Payer: Self-pay | Admitting: Oncology

## 2015-08-09 VITALS — BP 136/72 | HR 108 | Temp 98.4°F | Resp 18 | Ht 64.0 in | Wt 160.0 lb

## 2015-08-09 DIAGNOSIS — R799 Abnormal finding of blood chemistry, unspecified: Secondary | ICD-10-CM | POA: Diagnosis not present

## 2015-08-09 DIAGNOSIS — N926 Irregular menstruation, unspecified: Secondary | ICD-10-CM

## 2015-08-09 DIAGNOSIS — Z803 Family history of malignant neoplasm of breast: Secondary | ICD-10-CM | POA: Diagnosis not present

## 2015-08-09 DIAGNOSIS — D500 Iron deficiency anemia secondary to blood loss (chronic): Secondary | ICD-10-CM

## 2015-08-09 DIAGNOSIS — D649 Anemia, unspecified: Secondary | ICD-10-CM

## 2015-08-09 MED ORDER — FERROUS SULFATE 325 (65 FE) MG PO TBEC: 325.0000 mg | DELAYED_RELEASE_TABLET | Freq: Three times a day (TID) | ORAL | Status: AC

## 2015-08-09 NOTE — Consult Note (Signed)
Reason for Referral: Microcytic anemia.   HPI: 25 year old woman currently of [place] where she lived the majority of her life. She is a rather healthy woman without any significant comorbid conditions. She complains of irregular menstrual cycles and have done so for a last few years. She has had heavy menstrual cycles at times as well as irregularities of her menses. She was also recently diagnosed with a bacterial vaginosis and PID. She have not been evaluated by gynecology for these issues. She did have a CBC done on 06/09/2015 which showed a hemoglobin of 9.8. Her MCV was 69.7. In January 2016 her hemoglobin was 10 with MCV of 69 and a platelet count of 419. She had been prescribed oral iron in October 2016 but did not take it regularly. She felt that it was not helping her especially with pertaining to her menstrual cycles. She did report constipation associated with it but for the most part did not have any major complications from it. Currently, she reports very little symptoms. She does have mild fatigue but does not report any shortness of breath or dyspnea on exertion. He is able to work and perform all activities of daily living. Has not reported any other bleeding such as GI or GU bleeding.  She does not report any headaches, blurry vision, syncope or seizures. She has not reported fevers or chills or sweats. He does not report any cough, wheezing or hemoptysis. She does not report any nausea, vomiting or abdominal pain. He does not report any constipation, diarrhea hematochezia or melena. She does not report any frequency, urgency or hesitancy. She does not report any skeletal complaints. She does not report any arthralgias or myalgias. Remaining review of systems unremarkable.  Past medical history is unremarkable for any history of hypertension, diabetes or heart disease. She denied any personal history of hemoglobinopathy such as sickle cell or thalassemia. She denied any surgeries.       Current outpatient prescriptions: Her medication reviewed and she does not take any of these medication mentioned below. Marland Kitchen.  acetaminophen (TYLENOL) 325 MG tablet, Take 650 mg by mouth every 6 (six) hours as needed for mild pain., Disp: , Rfl:  .  doxycycline (VIBRAMYCIN) 100 MG capsule, Take 1 capsule (100 mg total) by mouth 2 (two) times daily., Disp: 20 capsule, Rfl: 0 .  ibuprofen (ADVIL,MOTRIN) 600 MG tablet, Take 1 tablet (600 mg total) by mouth every 6 (six) hours as needed., Disp: 30 tablet, Rfl: 0 .  oxyCODONE-acetaminophen (PERCOCET/ROXICET) 5-325 MG per tablet, 1 to 2 tabs PO q6hrs  PRN for pain, Disp: 15 tablet, Rfl: 0 .  ferrous sulfate 325 (65 FE) MG EC tablet, Take 1 tablet (325 mg total) by mouth 3 (three) times daily with meals., Disp: 120 tablet, Rfl: 3:  Allergies  Allergen Reactions  . Sulfa Antibiotics Swelling  :  No family history of thalassemia or anemia. There is history of breast cancer and diabetes remotely.   Social History   Social History  . Marital Status: Married    Spouse Name: N/A  . Number of Children: N/A  . Years of Education: N/A   Occupational History  . Not on file.   Social History Main Topics  . Smoking status: Never Smoker   . Smokeless tobacco: Never Used  . Alcohol Use: No  . Drug Use: No  . Sexual Activity: Not on file   Other Topics Concern  . Not on file   Social History Narrative  :  Pertinent  items are noted in HPI.  Exam: Blood pressure 136/72, pulse 108, temperature 98.4 F (36.9 C), temperature source Oral, resp. rate 18, height 5\' 4"  (1.626 m), weight 160 lb (72.576 kg), SpO2 98 %. General appearance: alert and cooperative Head: Normocephalic, without obvious abnormality Throat: lips, mucosa, and tongue normal; teeth and gums normal Neck: no adenopathy Resp: clear to auscultation bilaterally Chest wall: no tenderness Cardio: regular rate and rhythm, S1, S2 normal, no murmur, click, rub or gallop GI: soft,  non-tender; bowel sounds normal; no masses,  no organomegaly Extremities: extremities normal, atraumatic, no cyanosis or edema Pulses: 2+ and symmetric Skin: Skin color, texture, turgor normal. No rashes or lesions Lymph nodes: Cervical, supraclavicular, and axillary nodes normal.  CBC    Component Value Date/Time   WBC 7.4 06/09/2015 1933   RBC 4.46 06/09/2015 1933   HGB 9.8* 06/09/2015 1933   HCT 31.1* 06/09/2015 1933   PLT 346 06/09/2015 1933   MCV 69.7* 06/09/2015 1933   MCH 22.0* 06/09/2015 1933   MCHC 31.5 06/09/2015 1933   RDW 15.5 06/09/2015 1933   LYMPHSABS 2.9 09/07/2014 1624   MONOABS 0.8 09/07/2014 1624   EOSABS 0.2 09/07/2014 1624   BASOSABS 0.0 09/07/2014 1624      Chemistry      Component Value Date/Time   NA 134* 06/09/2015 1933   K 3.5 06/09/2015 1933   CL 104 06/09/2015 1933   CO2 23 06/09/2015 1933   BUN 7 06/09/2015 1933   CREATININE 0.85 06/09/2015 1933      Component Value Date/Time   CALCIUM 9.1 06/09/2015 1933   ALKPHOS 63 06/09/2015 1933   AST 23 06/09/2015 1933   ALT 17 06/09/2015 1933   BILITOT 0.4 06/09/2015 1933        Assessment and Plan:   25 year old woman with the following issues:  1. Microcytosis and anemia: This has been documented in 2 separate occasions in 2016 with a hemoglobin range between 9 and 10. Her MCV was around 69 with elevated RDW and occasional increased platelets. Her RBC morphology did show target cells on peripheral smear. The differential diagnosis include iron deficiency anemia versus hemoglobinopathy. Thalassemia or sickle cell anemia could be also contributing to an existing iron deficiency. Given her heavy menstrual cycles it is also very likely that we are dealing with iron deficiency as well.  I have recommended aggressive iron replacement for 3 months. I've asked her to take iron sulfate daily and to use stool softener to combat constipation. I will repeat her iron studies in 3 months I will also obtain a  hemoglobin electrophoresis to rule out a concomitant hemoglobinopathy.  We also discussed the role of IV iron in case her iron deficiency does not respond to oral iron. At this point she does not need IV iron.  2. Irregular menstrual cycles: I recommended gynecology evaluation regarding this issue also thyroid check with her primary provider. She understands a lesser menorrhagia and heavy menstrual bleeding slows down, her iron deficiency might become a recurrent issue.

## 2015-08-09 NOTE — Progress Notes (Signed)
Please see consult note.  

## 2015-08-09 NOTE — Telephone Encounter (Signed)
Talked to patient here in office. Scheduled appt.       [facility]. °

## 2015-11-08 ENCOUNTER — Ambulatory Visit: Payer: BLUE CROSS/BLUE SHIELD | Admitting: Oncology

## 2015-11-08 ENCOUNTER — Other Ambulatory Visit: Payer: BLUE CROSS/BLUE SHIELD

## 2017-09-20 IMAGING — US US PELVIS COMPLETE
1 series · 13 of 13 positions shown · non-contrast
Comparison: None.

CLINICAL DATA: Pelvic pain.

EXAM:
TRANSABDOMINAL ULTRASOUND OF PELVIS
TECHNIQUE: Transabdominal ultrasound examination of the pelvis was performed
including evaluation of the uterus, ovaries, adnexal regions, and
pelvic cul-de-sac.

[Series 1: us pelvis complete · 0.22mm/px · 13 of 13 slices shown]
[im 1/13]
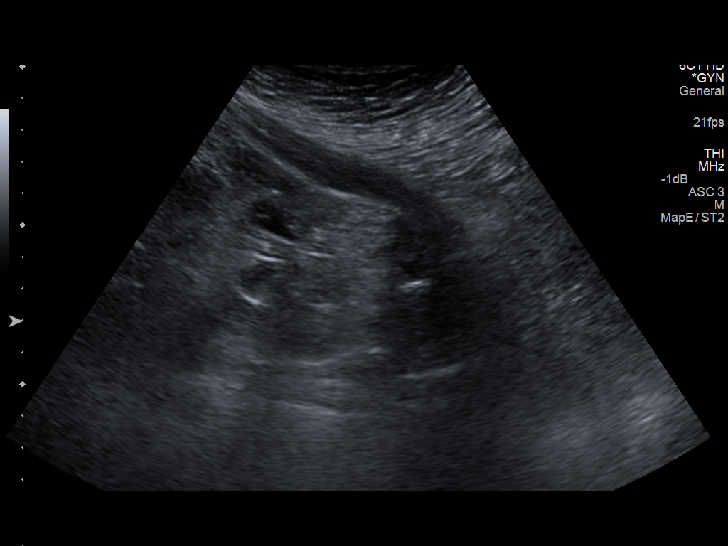
[im 2/13]
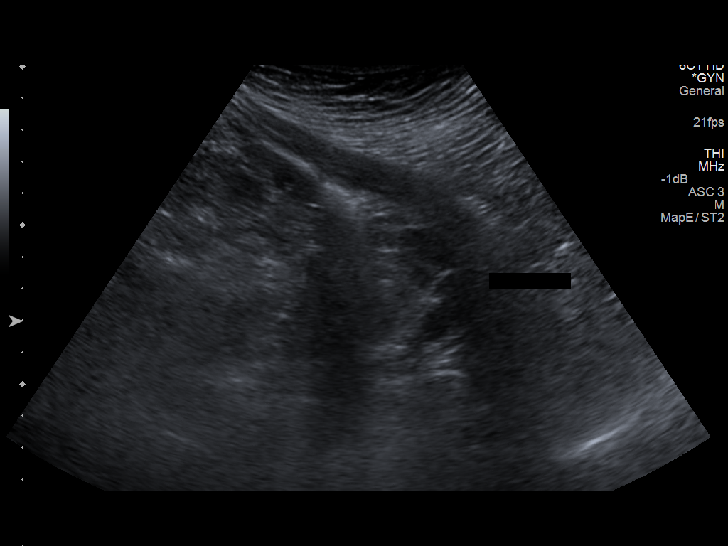
[im 3/13]
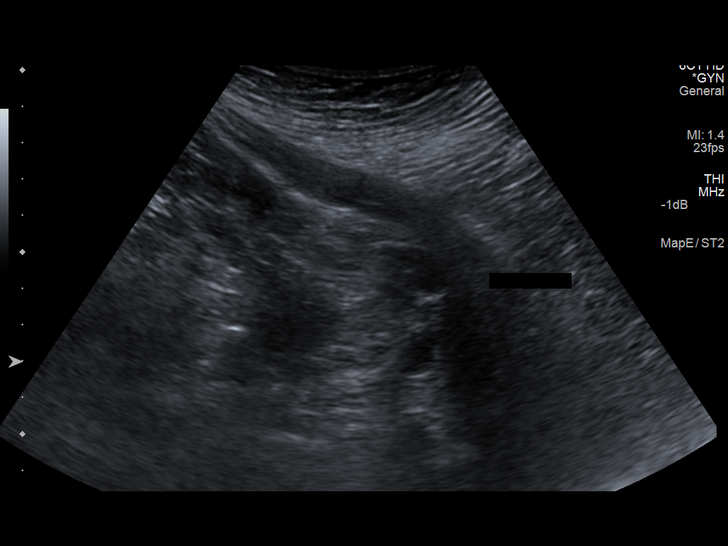
[im 4/13]
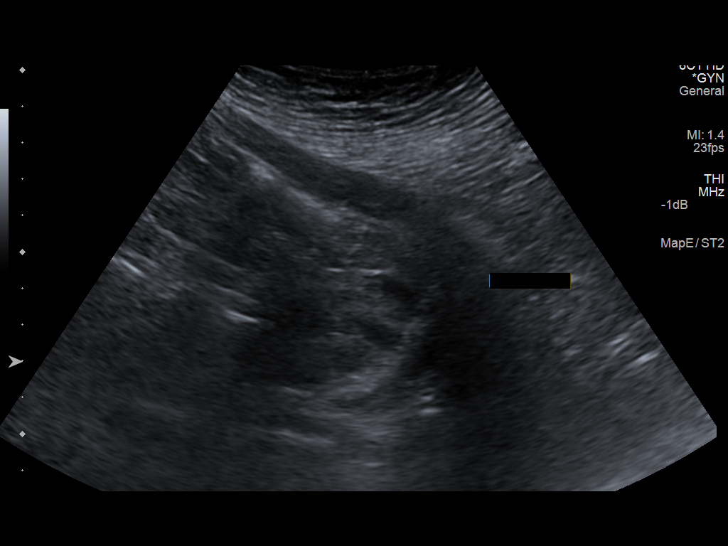
[im 5/13]
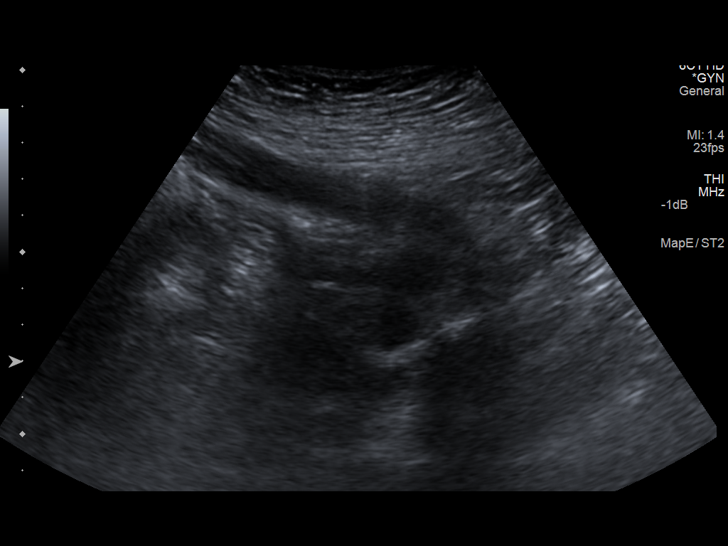
[im 6/13]
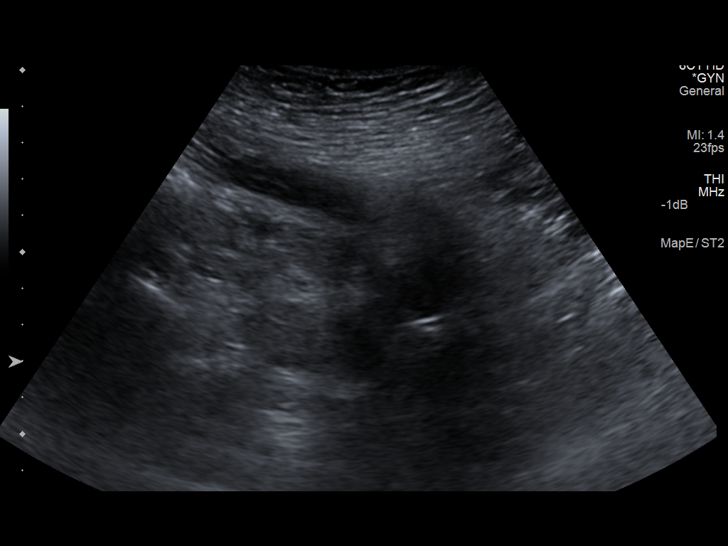
[im 7/13]
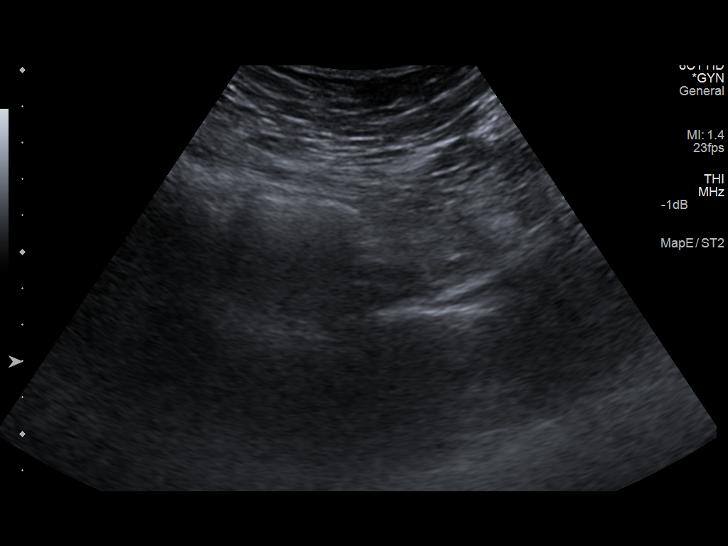
[im 8/13]
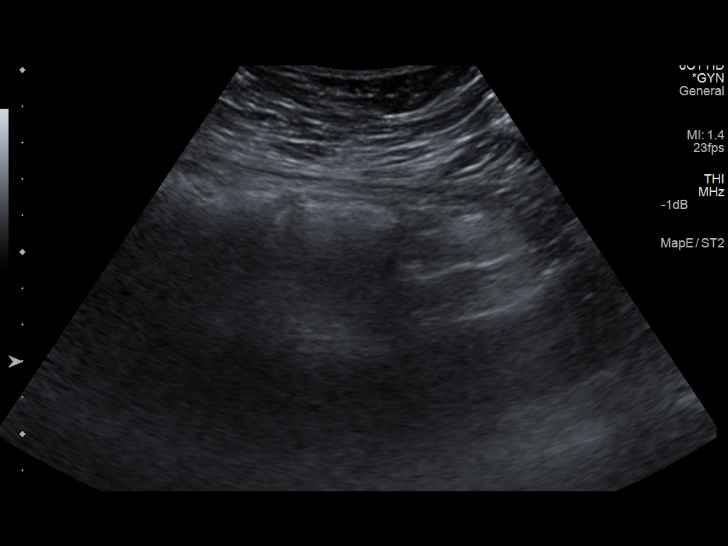
[im 9/13]
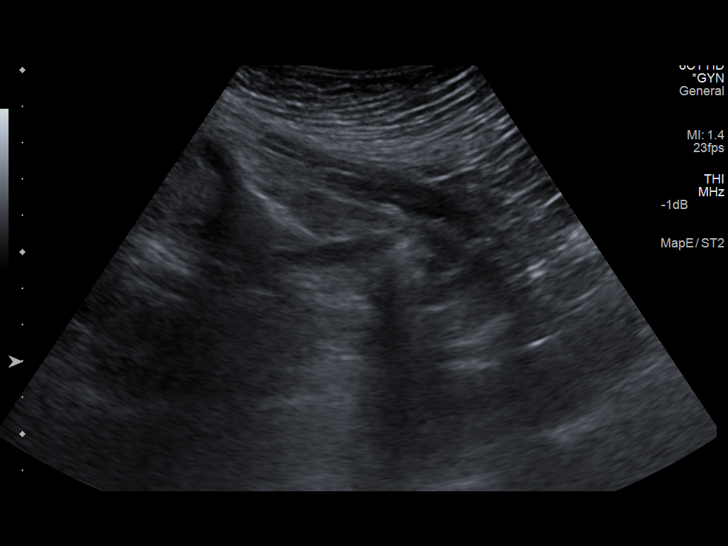
[im 10/13]
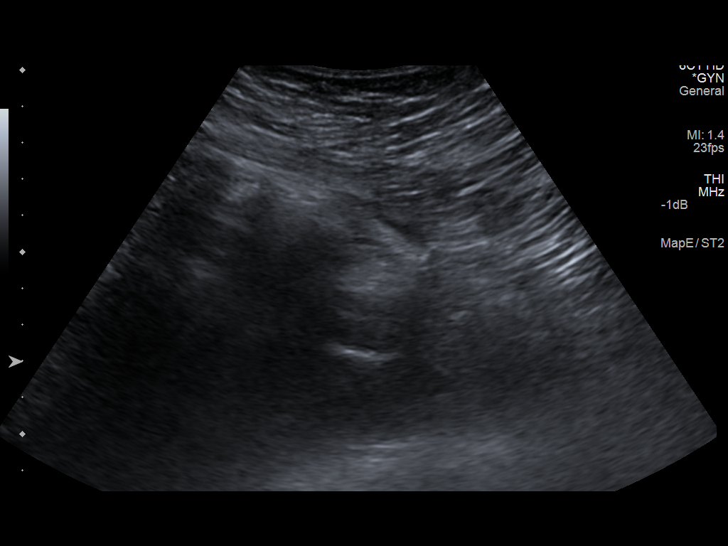
[im 11/13]
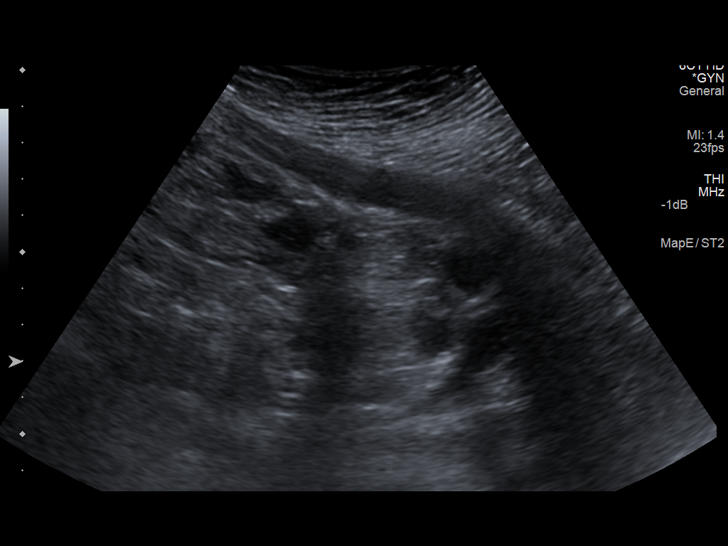
[im 12/13]
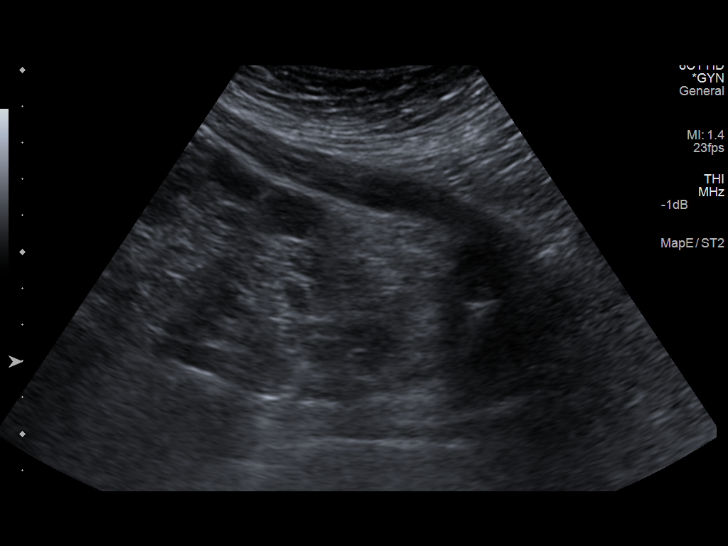
[im 13/13]
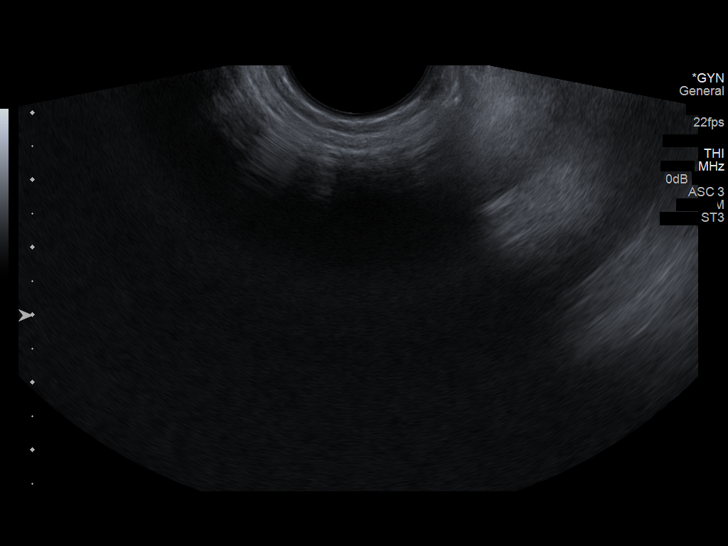

[13 of 13 positions shown; findings below may reference images not displayed]

FINDINGS: Uterus

Not visualized.

Endometrium

Not visualized

Right ovary

Not visualized

Left ovary

Not visualized

Other findings: There are significant study limitations as the
patient was in too much pain for an endovaginal examination and her
bladder was completely empty. The uterus was not visible. Neither
ovary was visible.
IMPRESSION: Technically inadequate study due to patient discomfort and empty
bladder. Uterus and ovaries were not visible. Shadowing bowel gas
completely obscures the pelvic midline and adnexal regions.
